# Patient Record
Sex: Female | Born: 1990 | Race: Black or African American | Hispanic: No | Marital: Single | Smoking: Never smoker
Health system: Southern US, Community
[De-identification: ages and names within clinical notes are randomized; demographics above are authoritative.]

## PROBLEM LIST (undated history)

## (undated) DIAGNOSIS — Z309 Encounter for contraceptive management, unspecified: Secondary | ICD-10-CM

## (undated) DIAGNOSIS — Z973 Presence of spectacles and contact lenses: Secondary | ICD-10-CM

## (undated) HISTORY — DX: Presence of spectacles and contact lenses: Z97.3

## (undated) HISTORY — DX: Encounter for contraceptive management, unspecified: Z30.9

---

## 2011-03-06 ENCOUNTER — Encounter (HOSPITAL_COMMUNITY): Payer: Self-pay | Admitting: Emergency Medicine

## 2011-03-06 ENCOUNTER — Emergency Department (HOSPITAL_COMMUNITY)
Admission: EM | Admit: 2011-03-06 | Discharge: 2011-03-07 | Disposition: A | Discharge status: Home / Self Care | Payer: Medicaid Other | Attending: Emergency Medicine | Admitting: Emergency Medicine

## 2011-03-06 DIAGNOSIS — Z79899 Other long term (current) drug therapy: Secondary | ICD-10-CM | POA: Insufficient documentation

## 2011-03-06 DIAGNOSIS — M545 Low back pain, unspecified: Secondary | ICD-10-CM | POA: Insufficient documentation

## 2011-03-06 DIAGNOSIS — R110 Nausea: Secondary | ICD-10-CM | POA: Insufficient documentation

## 2011-03-06 DIAGNOSIS — R109 Unspecified abdominal pain: Secondary | ICD-10-CM | POA: Insufficient documentation

## 2011-03-06 DIAGNOSIS — N390 Urinary tract infection, site not specified: Secondary | ICD-10-CM | POA: Insufficient documentation

## 2011-03-06 NOTE — Discharge Instructions (Signed)
Abdominal Pain Abdominal pain can be caused by many things. Your caregiver decides the seriousness of your pain by an examination and possibly blood tests and X-rays. Many cases can be observed and treated at home. Most abdominal pain is not caused by a disease and will probably improve without treatment. However, in many cases, more time must pass before a clear cause of the pain can be found. Before that point, it may not be known if you need more testing, or if hospitalization or surgery is needed. HOME CARE INSTRUCTIONS   Do not take laxatives unless directed by your caregiver.   Take pain medicine only as directed by your caregiver.   Only take over-the-counter or prescription medicines for pain, discomfort, or fever as directed by your caregiver.   Try a clear liquid diet (broth, tea, or water) for as long as directed by your caregiver. Slowly move to a bland diet as tolerated.  SEEK IMMEDIATE MEDICAL CARE IF:   The pain does not go away.   You have a fever.   You keep throwing up (vomiting).   The pain is felt only in portions of the abdomen. Pain in the right side could possibly be appendicitis. In an adult, pain in the left lower portion of the abdomen could be colitis or diverticulitis.   You pass bloody or black tarry stools.  MAKE SURE YOU:   Understand these instructions.   Will watch your condition.   Will get help right away if you are not doing well or get worse.  Document Released: 10/07/2004 Document Revised: 09/09/2010 Document Reviewed: 08/16/2007 Exit[employer] Patient Information 2012 [employer]  RESOURCE GUIDE  Dental Problems  Patients with Medicaid: [facility]                     [facility] [address]                                           [address] Phone:  [phone]                                                   Phone:  [phone]  If unable to pay or uninsured, contact:  [facility] or [place] [facility] to become qualified for the adult dental clinic.  Chronic Pain Problems Contact [facility]  [phone] Patients need to be referred by their primary care doctor.  Insufficient Money for Medicine Contact [employer]:  call "211" or [facility] 271-5999.  No Primary Care Doctor Call [facility]  832-8000 Other agencies that provide inexpensive medical care    [facility]  [phone]    [facility]  [phone]    [facility]  [phone]    [facility]  [phone]    [facility]  [phone]    [facility]  [phone]  [facility]  [phone] [facility]  [phone] [facility]   800 853-5163 (emergency services 641-4993)  Abuse/Neglect [place] Child Abuse Hotline (336) 641-3795 [place] [facility] Abuse Hotline 800-378-5315 (After Hours)  Emergency Shelter [facility] (336) 271-5985  Maternity Homes   [facility] (336) 275-9566 [facility] (704) 372-4663  MRSA Hotline #:   832-7006    [place] [facility]  [facility] [place]  [employer]                           [facility] [address] [place]                     [address]         [address]  [place]                                               [place]                              [place] Phone:  [phone]                                  Phone:  [phone]                   Phone:  [phone]  [facility] Phone:  [phone]  [place] Child Abuse Hotline (336) 342-1394 (336) 342-3537 (After Hours)  

## 2011-03-06 NOTE — ED Notes (Signed)
Pt states that she has had left sided abdominal pain for 4-5 days.  Denies vomiting/diarrhea.  Has been nauseous.  States pain 8/10.  Pt is laughing, joking and talking on the phone.

## 2011-03-06 NOTE — ED Provider Notes (Signed)
History     CSN: [id number]  Arrival date & time 03/06/11  2057   First MD Initiated Contact with Patient 03/06/11 2303      Chief Complaint  Patient presents with  . Abdominal Pain    left    (Consider location/radiation/quality/duration/timing/severity/associated sxs/prior treatment) HPI  C/o constant Lt abdominal pain x 4-5 days. Gradually worsening. +nausea, no vomiting. Denies back pain. Denies constipation/diarrhea. Denies hematuria/dysuria/freq/urgency today but states recently more frequency. Denies fever/chills. Worse with lying on left side or bending over twd left side. No alleviating factors. Has not taken anything for pain pta.  No sick contacts. Denies vaginal discharge/itching. No h/o STI. She is not complaining of abdominal pain at this time. Tolerating PO without difficulty.  No h/o abdominal surgeries  ED Notes, ED Provider Notes from 03/06/11 0000 to 03/06/11 21:15:10       Joellyn Haff, RN 03/06/2011 21:11      Pt states that she has had left sided abdominal pain for 4-5 days. Denies vomiting/diarrhea. Has been nauseous. States pain 8/10. Pt is laughing, joking and talking on the phone.    History reviewed. No pertinent past medical history.  History reviewed. No pertinent past surgical history.  History reviewed. No pertinent family history.  History  Substance Use Topics  . Smoking status: Never Smoker   . Smokeless tobacco: Not on file  . Alcohol Use: Yes    OB History    Grav Para Term Preterm Abortions TAB SAB Ect Mult Living                  Review of Systems  All other systems reviewed and are negative.  except as noted HPI   Allergies  Review of patient's allergies indicates no known allergies.  Home Medications   Current Outpatient Rx  Name Route Sig Dispense Refill  . CIPROFLOXACIN HCL 250 MG PO TABS Oral Take 1 tablet (250 mg total) by mouth every 12 (twelve) hours. 6 tablet 0    LMP 02/16/2011  Physical Exam    Nursing note and vitals reviewed. Constitutional: She is oriented to person, place, and time. She appears well-developed.  HENT:  Head: Atraumatic.  Mouth/Throat: Oropharynx is clear and moist.  Eyes: Conjunctivae and EOM are normal. Pupils are equal, round, and reactive to light.  Neck: Normal range of motion. Neck supple.  Cardiovascular: Normal rate, regular rhythm, normal heart sounds and intact distal pulses.   Pulmonary/Chest: Effort normal and breath sounds normal. No respiratory distress. She has no wheezes. She has no rales.  Abdominal: Soft. She exhibits no distension. There is no tenderness. There is no rebound and no guarding.       No CVAT  Genitourinary:       Bimanual exam without CMT  Musculoskeletal: Normal range of motion.       Min Lt lumbar paraspinal/lt flank ttp  Neurological: She is alert and oriented to person, place, and time.  Skin: Skin is warm and dry. No rash noted.  Psychiatric: She has a normal mood and affect.    ED Course  Procedures (including critical care time)  Labs Reviewed  URINALYSIS, ROUTINE W REFLEX MICROSCOPIC - Abnormal; Notable for the following:    APPearance CLOUDY (*)    Specific Gravity, Urine 1.031 (*)    Ketones, ur TRACE (*)    Leukocytes, UA MODERATE (*)    All other components within normal limits  URINE MICROSCOPIC-ADD ON - Abnormal; Notable for the following:  Squamous Epithelial / LPF MANY (*)    Bacteria, UA FEW (*)    Casts HYALINE CASTS (*)    All other components within normal limits  PREGNANCY, URINE  LAB REPORT - SCANNED   No results found.   1. UTI (lower urinary tract infection)       MDM  Patient presents with constant left upper quadrant, left lower quadrant abdominal pain for the past 4-5 days. She states she is having no pain at this time. No medications were taken pta or in the ED to relieve her pain. Her abdomen is completely nontender. She has mild Lt flank/paraspinal lumbar ttp. There is no  rebound, guarding or fullness. Her bimanual exam is unremarkable. She appears well. She is afebrile, her vitals are stable. She is well-hydrated. I do not feel that she needs labs or imaging of her abdomen at this time. She is comfortable with the plan. U/A and urine pregnancy ordered.  Urine sample contaminated but will treat given recent urinary frequency. F/U student health as needed. Precautions for return.      Forbes Cellar, MD 03/07/11 [phone]

## 2011-03-06 NOTE — ED Notes (Signed)
Lavender and Light green sent to lab.

## 2011-03-07 LAB — URINE MICROSCOPIC-ADD ON

## 2011-03-07 LAB — URINALYSIS, ROUTINE W REFLEX MICROSCOPIC
Bilirubin Urine: NEGATIVE
Nitrite: NEGATIVE
Specific Gravity, Urine: 1.031 — ABNORMAL HIGH (ref 1.005–1.030)
Urobilinogen, UA: 1 mg/dL (ref 0.0–1.0)
pH: 7 (ref 5.0–8.0)

## 2011-03-07 MED ORDER — CIPROFLOXACIN HCL 250 MG PO TABS: 250.0000 mg | ORAL_TABLET | Freq: Two times a day (BID) | ORAL | Status: AC

## 2012-05-19 ENCOUNTER — Emergency Department (HOSPITAL_COMMUNITY)
Admission: EM | Admit: 2012-05-19 | Discharge: 2012-05-19 | Disposition: A | Discharge status: Home / Self Care | Payer: BC Managed Care – PPO | Attending: Emergency Medicine | Admitting: Emergency Medicine

## 2012-05-19 ENCOUNTER — Encounter (HOSPITAL_COMMUNITY): Payer: Self-pay | Admitting: Emergency Medicine

## 2012-05-19 ENCOUNTER — Emergency Department (HOSPITAL_COMMUNITY): Payer: BC Managed Care – PPO

## 2012-05-19 DIAGNOSIS — S93601A Unspecified sprain of right foot, initial encounter: Secondary | ICD-10-CM

## 2012-05-19 DIAGNOSIS — X500XXA Overexertion from strenuous movement or load, initial encounter: Secondary | ICD-10-CM | POA: Insufficient documentation

## 2012-05-19 DIAGNOSIS — S93609A Unspecified sprain of unspecified foot, initial encounter: Secondary | ICD-10-CM | POA: Insufficient documentation

## 2012-05-19 DIAGNOSIS — Y9289 Other specified places as the place of occurrence of the external cause: Secondary | ICD-10-CM | POA: Insufficient documentation

## 2012-05-19 DIAGNOSIS — Y9301 Activity, walking, marching and hiking: Secondary | ICD-10-CM | POA: Insufficient documentation

## 2012-05-19 DIAGNOSIS — W010XXA Fall on same level from slipping, tripping and stumbling without subsequent striking against object, initial encounter: Secondary | ICD-10-CM | POA: Insufficient documentation

## 2012-05-19 MED ORDER — TRAMADOL HCL 50 MG PO TABS: 50.0000 mg | ORAL_TABLET | Freq: Four times a day (QID) | ORAL | Status: DC | PRN

## 2012-05-19 NOTE — ED Notes (Signed)
Pt verbalizes understanding 

## 2012-05-19 NOTE — ED Notes (Signed)
Pt c/o R foot pain after fall down steps @ 1400. Slight swelling noted.

## 2012-05-19 NOTE — ED Provider Notes (Signed)
History    This chart was scribed for Magnus Sinning, PA working with Gerhard Munch, MD by ED Scribe, Burman Nieves. This patient was seen in room WTR8/WTR8 and the patient's care was started at 11:04 PM.   CSN: [id number]  Arrival date & time 05/19/12  2243   First MD Initiated Contact with Patient 05/19/12 2304      Chief Complaint  Patient presents with  . Foot Injury    (Consider location/radiation/quality/duration/timing/severity/associated sxs/prior treatment) Patient is a 22 y.o. female presenting with foot injury. The history is provided by the patient. No language interpreter was used.  Foot Injury Location:  Foot Foot location:  R foot Pain details:    Timing:  Constant Worsened by:  Bearing weight and activity  HPI Comments: Braniyah Besse is a 22 y.o. female who presents to the Emergency Department complaining of moderate constant right foot pain due to falling earlier today at 4PM. Pt states she was walking down some steps when she tripped and fell injuring her right foot. She states after incident she went out to eat after her room mates graduation and noticed it started to swell and it became harder for her to ambulate. Currently pt ambulates with a right leg limp. She has not taken anything for pain prior to arrival.  Pt denies any LOC, headache, numbness, tingling, fever, chills,  nausea, vomiting,  weakness, and any other associated symptoms.   History reviewed. No pertinent past medical history.  History reviewed. No pertinent past surgical history.  No family history on file.  History  Substance Use Topics  . Smoking status: Never Smoker   . Smokeless tobacco: Not on file  . Alcohol Use: Yes     Comment: occ    OB History   Grav Para Term Preterm Abortions TAB SAB Ect Mult Living                  Review of Systems  Musculoskeletal: Positive for myalgias, joint swelling and arthralgias.  All other systems reviewed and are  negative.    Allergies  Review of patient's allergies indicates no known allergies.  Home Medications  No current outpatient prescriptions on file.  BP 135/61  Pulse 97  Temp(Src) 98.2 F (36.8 C) (Oral)  Resp 20  Wt 160 lb (72.576 kg)  SpO2 100%  LMP 05/15/2012  Physical Exam  Nursing note and vitals reviewed. Constitutional: She appears well-developed and well-nourished.  HENT:  Head: Normocephalic and atraumatic.  Mouth/Throat: Oropharynx is clear and moist.  Eyes: EOM are normal. Pupils are equal, round, and reactive to light.  Neck: Normal range of motion. Neck supple.  Cardiovascular: Normal rate, regular rhythm and normal heart sounds.   Pulses:      Dorsalis pedis pulses are 2+ on the right side.  Pulmonary/Chest: Effort normal and breath sounds normal. She has no wheezes.  Musculoskeletal: Normal range of motion. She exhibits tenderness.       Right ankle: She exhibits normal range of motion, no swelling, no ecchymosis and no deformity. No tenderness.  Swelling of the dorsal aspect of her right foot. Tenderness to palpation to the 4th and 5th metatarsal.  Neurological: She is alert.  Sensation intact in right foot  Skin: Skin is warm and dry.  Psychiatric: She has a normal mood and affect. Her behavior is normal.    ED Course  Procedures (including critical care time) DIAGNOSTIC STUDIES: Oxygen Saturation is 100% on room air, normal by my interpretation.  COORDINATION OF CARE: 11:23 PM Discussed ED treatment with pt and pt agrees. Advised to ice affected area for swelling and take prescription medication for pain.     Labs Reviewed - No data to display Dg Foot Complete Right  05/19/2012  *RADIOLOGY REPORT*  Clinical Data: Right foot pain and metatarsals, fell down stairs  RIGHT FOOT COMPLETE - 3+ VIEW  Comparison: None  Findings: Osseous mineralization normal. Joint spaces preserved. No acute fracture, dislocation or bone destruction.  IMPRESSION: No  acute osseous abnormalities.   Original Report Authenticated By: Ulyses Southward, M.D.      No diagnosis found.    MDM  Patient presenting with foot pain that occurred after twisting the foot earlier today.  Patient was able to ambulate, but had pain with ambulation.  Xray negative.  Patient neurovascularly intact.  Foot wrapped with ACE wrap and patient given crutches for comfort.    I personally performed the services described in this documentation, which was scribed in my presence. The recorded information has been reviewed and is accurate.   Pascal Lux [place], PA-C 05/20/12 2041

## 2012-05-20 NOTE — ED Provider Notes (Signed)
  Medical screening examination/treatment/procedure(s) were performed by non-physician practitioner and as supervising physician I was immediately available for consultation/collaboration.    Sayda Grable, MD 05/20/12 2339 

## 2013-06-06 ENCOUNTER — Ambulatory Visit (INDEPENDENT_AMBULATORY_CARE_PROVIDER_SITE_OTHER): Payer: BC Managed Care – PPO | Admitting: Physician Assistant

## 2013-06-06 VITALS — BP 110/64 | HR 84 | Temp 98.4°F | Resp 16 | Ht 63.75 in | Wt 172.0 lb

## 2013-06-06 DIAGNOSIS — Z0000 Encounter for general adult medical examination without abnormal findings: Secondary | ICD-10-CM

## 2013-06-06 DIAGNOSIS — Z111 Encounter for screening for respiratory tuberculosis: Secondary | ICD-10-CM

## 2013-06-06 NOTE — Patient Instructions (Signed)
Come back in 48-72 hours to have your TB test read (between 5-6pm Friday, or between 8am-5pm Saturday.)  Bring your paperwork with you to be completed.     Health Maintenance, Female A healthy lifestyle and preventative care can promote health and wellness.  Maintain regular health, dental, and eye exams.  Eat a healthy diet. Foods like vegetables, fruits, whole grains, low-fat dairy products, and lean protein foods contain the nutrients you need without too many calories. Decrease your intake of foods high in solid fats, added sugars, and salt. Get information about a proper diet from your caregiver, if necessary.  Regular physical exercise is one of the most important things you can do for your health. Most adults should get at least 150 minutes of moderate-intensity exercise (any activity that increases your heart rate and causes you to sweat) each week. In addition, most adults need muscle-strengthening exercises on 2 or more days a week.   Maintain a healthy weight. The body mass index (BMI) is a screening tool to identify possible weight problems. It provides an estimate of body fat based on height and weight. Your caregiver can help determine your BMI, and can help you achieve or maintain a healthy weight. For adults 20 years and older:  A BMI below 18.5 is considered underweight.  A BMI of 18.5 to 24.9 is normal.  A BMI of 25 to 29.9 is considered overweight.  A BMI of 30 and above is considered obese.  Maintain normal blood lipids and cholesterol by exercising and minimizing your intake of saturated fat. Eat a balanced diet with plenty of fruits and vegetables. Blood tests for lipids and cholesterol should begin at age 66 and be repeated every 5 years. If your lipid or cholesterol levels are high, you are over 50, or you are a high risk for heart disease, you may need your cholesterol levels checked more frequently.Ongoing high lipid and cholesterol levels should be treated with  medicines if diet and exercise are not effective.  If you smoke, find out from your caregiver how to quit. If you do not use tobacco, do not start.  Lung cancer screening is recommended for adults aged 56 80 years who are at high risk for developing lung cancer because of a history of smoking. Yearly low-dose computed tomography (CT) is recommended for people who have at least a 30-pack-year history of smoking and are a current smoker or have quit within the past 15 years. A pack year of smoking is smoking an average of 1 pack of cigarettes a day for 1 year (for example: 1 pack a day for 30 years or 2 packs a day for 15 years). Yearly screening should continue until the smoker has stopped smoking for at least 15 years. Yearly screening should also be stopped for people who develop a health problem that would prevent them from having lung cancer treatment.  If you are pregnant, do not drink alcohol. If you are breastfeeding, be very cautious about drinking alcohol. If you are not pregnant and choose to drink alcohol, do not exceed 1 drink per day. One drink is considered to be 12 ounces (355 mL) of beer, 5 ounces (148 mL) of wine, or 1.5 ounces (44 mL) of liquor.  Avoid use of street drugs. Do not share needles with anyone. Ask for help if you need support or instructions about stopping the use of drugs.  High blood pressure causes heart disease and increases the risk of stroke. Blood pressure should be  checked at least every 1 to 2 years. Ongoing high blood pressure should be treated with medicines, if weight loss and exercise are not effective.  If you are 53 to 22 years old, ask your caregiver if you should take aspirin to prevent strokes.  Diabetes screening involves taking a blood sample to check your fasting blood sugar level. This should be done once every 3 years, after age 96, if you are within normal weight and without risk factors for diabetes. Testing should be considered at a younger age or  be carried out more frequently if you are overweight and have at least 1 risk factor for diabetes.  Breast cancer screening is essential preventative care for women. You should practice "breast self-awareness." This means understanding the normal appearance and feel of your breasts and may include breast self-examination. Any changes detected, no matter how small, should be reported to a caregiver. Women in their 90s and 30s should have a clinical breast exam (CBE) by a caregiver as part of a regular health exam every 1 to 3 years. After age 80, women should have a CBE every year. Starting at age 36, women should consider having a mammogram (breast X-ray) every year. Women who have a family history of breast cancer should talk to their caregiver about genetic screening. Women at a high risk of breast cancer should talk to their caregiver about having an MRI and a mammogram every year.  Breast cancer gene (BRCA)-related cancer risk assessment is recommended for women who have family members with BRCA-related cancers. BRCA-related cancers include breast, ovarian, tubal, and peritoneal cancers. Having family members with these cancers may be associated with an increased risk for harmful changes (mutations) in the breast cancer genes BRCA1 and BRCA2. Results of the assessment will determine the need for genetic counseling and BRCA1 and BRCA2 testing.  The Pap test is a screening test for cervical cancer. Women should have a Pap test starting at age 72. Between ages 66 and 31, Pap tests should be repeated every 2 years. Beginning at age 30, you should have a Pap test every 3 years as long as the past 3 Pap tests have been normal. If you had a hysterectomy for a problem that was not cancer or a condition that could lead to cancer, then you no longer need Pap tests. If you are between ages 9 and 49, and you have had normal Pap tests going back 10 years, you no longer need Pap tests. If you have had past treatment  for cervical cancer or a condition that could lead to cancer, you need Pap tests and screening for cancer for at least 20 years after your treatment. If Pap tests have been discontinued, risk factors (such as a new sexual partner) need to be reassessed to determine if screening should be resumed. Some women have medical problems that increase the chance of getting cervical cancer. In these cases, your caregiver may recommend more frequent screening and Pap tests.  The human papillomavirus (HPV) test is an additional test that may be used for cervical cancer screening. The HPV test looks for the virus that can cause the cell changes on the cervix. The cells collected during the Pap test can be tested for HPV. The HPV test could be used to screen women aged 47 years and older, and should be used in women of any age who have unclear Pap test results. After the age of 70, women should have HPV testing at the same frequency as  a Pap test.  Colorectal cancer can be detected and often prevented. Most routine colorectal cancer screening begins at the age of 32 and continues through age 7. However, your caregiver may recommend screening at an earlier age if you have risk factors for colon cancer. On a yearly basis, your caregiver may provide home test kits to check for hidden blood in the stool. Use of a small camera at the end of a tube, to directly examine the colon (sigmoidoscopy or colonoscopy), can detect the earliest forms of colorectal cancer. Talk to your caregiver about this at age 20, when routine screening begins. Direct examination of the colon should be repeated every 5 to 10 years through age 45, unless early forms of pre-cancerous polyps or small growths are found.  Hepatitis C blood testing is recommended for all people born from [birth year] through 1964 and any individual with known risks for hepatitis C.  Practice safe sex. Use condoms and avoid high-risk sexual practices to reduce the spread of sexually  transmitted infections (STIs). Sexually active women aged 37 and younger should be checked for Chlamydia, which is a common sexually transmitted infection. Older women with new or multiple partners should also be tested for Chlamydia. Testing for other STIs is recommended if you are sexually active and at increased risk.  Osteoporosis is a disease in which the bones lose minerals and strength with aging. This can result in serious bone fractures. The risk of osteoporosis can be identified using a bone density scan. Women ages 76 and over and women at risk for fractures or osteoporosis should discuss screening with their caregivers. Ask your caregiver whether you should be taking a calcium supplement or vitamin D to reduce the rate of osteoporosis.  Menopause can be associated with physical symptoms and risks. Hormone replacement therapy is available to decrease symptoms and risks. You should talk to your caregiver about whether hormone replacement therapy is right for you.  Use sunscreen. Apply sunscreen liberally and repeatedly throughout the day. You should seek shade when your shadow is shorter than you. Protect yourself by wearing long sleeves, pants, a wide-brimmed hat, and sunglasses year round, whenever you are outdoors.  Notify your caregiver of new moles or changes in moles, especially if there is a change in shape or color. Also notify your caregiver if a mole is larger than the size of a pencil eraser.  Stay current with your immunizations. Document Released: 07/13/2010 Document Revised: 04/24/2012 Document Reviewed: 07/13/2010 [facility] Patient Information 2014 [employer].

## 2013-06-06 NOTE — Progress Notes (Signed)
   Subjective:    Patient ID: Sandra Howard, female    DOB: [birth year], 23 y.o.   MRN: [id number]  HPI    Sandra Howard is a very pleasant 23 yr old female here for CPE.  Last CPE in high school.  Currently no PCP as former PCP stopped practicing.  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  Complaints:  none LMP:  End of April 2015; due for next period tomorrow; regular every month Not currently sexually active - 3 lifetime partners; condoms 100% of the time Contraception:  OCPs GYN:  Had pap last year - normal; no concern for STI, declines testing today; last testing sometime last year - no new partners since Dentist:  Not regularly - afraid of dentist Eye doctor:  Glasses and contacts; last in October, goes annually Imm:  Thinks utd including gardasil Diet:   Could be better, picky eater; occ soda, mostly water Exercise:  occ but hasn't lately; 30 minutes cardio and exercise routine; 3x/wk when she was exercising Meds:  OCP. 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   HENT: Negative.   Respiratory: Negative.   Cardiovascular: Negative.   Gastrointestinal: Negative.   Musculoskeletal: Negative.   Skin: Negative.        Objective:   Physical Exam  Vitals reviewed. Constitutional: She is oriented to person, place, and time. She appears well-developed and well-nourished. No distress.  HENT:  Head: Normocephalic and atraumatic.  Right Ear: Tympanic membrane and ear canal normal.  Left Ear: Tympanic membrane and ear canal normal.  Mouth/Throat: Uvula is midline, oropharynx is clear and moist and mucous membranes are normal.  Eyes:  Conjunctivae and EOM are normal. Pupils are equal, round, and reactive to light. No scleral icterus.  Neck: Normal range of motion. Neck supple.  Cardiovascular: Normal rate, regular rhythm and normal heart sounds.   Pulmonary/Chest: Effort normal and breath sounds normal. She has no wheezes. She has no rales.  Abdominal: Soft. Bowel sounds are normal. There is no tenderness.  Musculoskeletal: She exhibits no edema.  Lymphadenopathy:    She has no cervical adenopathy.  Neurological: She is alert and oriented to person, place, and time. She has normal reflexes.  Skin: Skin is warm and dry.  Psychiatric: She has a normal mood and affect. Her behavior is normal.       Assessment & Plan:  Routine general medical examination at a health care facility  Screening for tuberculosis - Plan: TB Skin Test   Sandra Howard is a very pleasant 23 yr old female here for CPE.  She appears to be in good health.  Exam is normal.  UTD on imm.  Normal pap last year (due in 2017.)  Pt declines lab work today.  PPD placed - RTC 48-72 hours for read.  PPW completed.     Loleta Dicker MHS, PA-C [facility] 5/28/20157:25 PM

## 2013-06-06 NOTE — Progress Notes (Signed)

## 2013-06-08 ENCOUNTER — Ambulatory Visit (INDEPENDENT_AMBULATORY_CARE_PROVIDER_SITE_OTHER): Payer: BC Managed Care – PPO | Admitting: Physician Assistant

## 2013-06-08 DIAGNOSIS — Z09 Encounter for follow-up examination after completed treatment for conditions other than malignant neoplasm: Secondary | ICD-10-CM

## 2013-06-08 DIAGNOSIS — Z111 Encounter for screening for respiratory tuberculosis: Secondary | ICD-10-CM

## 2013-06-08 LAB — TB SKIN TEST
Induration: 0 mm
TB SKIN TEST: NEGATIVE

## 2013-06-08 NOTE — Progress Notes (Signed)
Patient here for nursing visit only.  The patient was not seen by a provider.  Here for TB read only.  

## 2013-07-04 ENCOUNTER — Ambulatory Visit (INDEPENDENT_AMBULATORY_CARE_PROVIDER_SITE_OTHER): Payer: BC Managed Care – PPO | Admitting: Medical

## 2013-07-04 ENCOUNTER — Encounter: Payer: Self-pay | Admitting: Medical

## 2013-07-04 VITALS — BP 100/68 | HR 82 | Temp 98.2°F | Resp 16 | Ht 64.0 in | Wt 174.0 lb

## 2013-07-04 DIAGNOSIS — Z0000 Encounter for general adult medical examination without abnormal findings: Secondary | ICD-10-CM

## 2013-07-04 DIAGNOSIS — Z113 Encounter for screening for infections with a predominantly sexual mode of transmission: Secondary | ICD-10-CM

## 2013-07-04 DIAGNOSIS — K5900 Constipation, unspecified: Secondary | ICD-10-CM

## 2013-07-04 DIAGNOSIS — Z3002 Counseling and instruction in natural family planning to avoid pregnancy: Secondary | ICD-10-CM

## 2013-07-04 LAB — POCT URINALYSIS DIPSTICK
BILIRUBIN UA: NEGATIVE
GLUCOSE UA: NEGATIVE
Ketones, UA: NEGATIVE
Nitrite, UA: NEGATIVE
RBC UA: NEGATIVE
Urobilinogen, UA: NEGATIVE
pH, UA: 7

## 2013-07-04 LAB — POCT WET PREP (WET MOUNT)
Clue Cells Wet Prep Whiff POC: NEGATIVE
WBC, Wet Prep HPF POC: NEGATIVE

## 2013-07-04 LAB — POCT URINE PREGNANCY: PREG TEST UR: NEGATIVE

## 2013-07-04 MED ORDER — LEVONORGEST-ETH ESTRAD 91-DAY 0.15-0.03 MG PO TABS: 1.0000 | ORAL_TABLET | Freq: Every day | ORAL | Status: AC

## 2013-07-04 NOTE — Patient Instructions (Signed)
Thank you for giving me the opportunity to serve you today.    Your diagnosis today includes: Encounter Diagnoses  Name Primary?  . Counseling and instruction in natural family planning to avoid pregnancy Yes  . Unspecified constipation   . Routine general medical examination at a health care facility   . Screen for STD (sexually transmitted disease)      Specific recommendations today include:  Begin Seasonale birth control the Sunday after your period starts.  Continue eating healthy, exercising regularly  Check insurance about your copay/cost for routine screening labs for anemia, cholesterol, blood sugar and thyroid.  Return for physical in 05/2014, sooner if needed.    I have included other useful information below for your review.  Ethinyl Estradiol; Levonorgestrel tablets What is this medicine? ETHINYL ESTRADIOL; LEVONORGESTREL (ETH in il es tra DYE ole; LEE voh nor jes trel) is an oral contraceptive. It combines two types of female hormones, an estrogen and a progestin. They are used to prevent ovulation and pregnancy. This medicine may be used for other purposes; ask your health care Yates Weisgerber or pharmacist if you have questions. COMMON BRAND NAME(S): Alesse, Altavera, Amethia, Amethia Lo, Amethyst, Aubra-28, Aviane, Camrese, Camrese Lo, Columbiahateal, [place], [address],Krise 3Delyla, KeotaEnpresse, [place] CityFALMINA, Bloom[place]ntrovale, HuntJolessa, BlodgettKurvelo, [place] Valley[place]ina, GarrettLevlen, HonorLevlite, PontiacLEVONEST, CrowleyLevora, OpelousasLoSeasonique, Bakers[place]Lutera, EvanstonLybrel, FeltMARLISSA, DanteMyzilra, CayeyNordette, Port GibsonOrsythia, ManchesterPortia, ClintonQuartette, ManitouQuasense, LindenSeasonale, Port Tobacco VillageSeasonique, Spring MillsSronyx, Miller Cityri-Levlen, Triphasil, Lennie Hummerrivora What should I tell my health care Ardath Lepak before I take this medicine? They need to know if you have or ever had any of these conditions: -abnormal vaginal bleeding -blood vessel disease or blood clots -breast, cervical, endometrial, ovarian, liver, or uterine cancer -diabetes -gallbladder disease -heart disease or recent heart attack -high blood pressure -high  cholesterol -kidney disease -liver disease -migraine headaches -stroke -systemic lupus erythematosus (SLE) -tobacco smoker -an unusual or allergic reaction to estrogens, progestins, other medicines, foods, dyes, or preservatives -pregnant or trying to get pregnant -breast-feeding How should I use this medicine? Take this medicine by mouth. To reduce nausea, this medicine may be taken with food. Follow the directions on the prescription label. Take this medicine at the same time each day and in the order directed on the package. Do not take your medicine more often than directed. Contact your pediatrician regarding the use of this medicine in children. Special care may be needed. This medicine has been used in female children who have started having menstrual periods. A patient package insert for the product will be given with each prescription and refill. Read this sheet carefully each time. The sheet may change frequently. Overdosage: If you think you have taken too much of this medicine contact a poison control center or emergency room at once. NOTE: This medicine is only for you. Do not share this medicine with others. What if I miss a dose? If you miss a dose, refer to the patient information sheet you received with your medicine for direction. If you miss more than one pill, this medicine may not be as effective and you may need to use another form of birth control. What may interact with this medicine? -acetaminophen -antibiotics or medicines for infections, especially rifampin, rifabutin, rifapentine, and griseofulvin, and possibly penicillins or tetracyclines -aprepitant -ascorbic acid (vitamin C) -atorvastatin -barbiturate medicines, such as phenobarbital -bosentan -carbamazepine -caffeine -clofibrate -cyclosporine -dantrolene -doxercalciferol -felbamate -grapefruit juice -hydrocortisone -medicines for anxiety or sleeping problems, such as diazepam or temazepam -medicines  for diabetes, including pioglitazone -mineral oil -modafinil -mycophenolate -nefazodone -oxcarbazepine -phenytoin -prednisolone -ritonavir or other medicines for HIV  infection or AIDS -rosuvastatin -selegiline -soy isoflavones supplements -St. John's wort -tamoxifen or raloxifene -theophylline -thyroid hormones -topiramate -warfarin This list may not describe all possible interactions. Give your health care Brittanny Levenhagen a list of all the medicines, herbs, non-prescription drugs, or dietary supplements you use. Also tell them if you smoke, drink alcohol, or use illegal drugs. Some items may interact with your medicine. What should I watch for while using this medicine? Visit your doctor or health care professional for regular checks on your progress. You will need a regular breast and pelvic exam and Pap smear while on this medicine. Use an additional method of contraception during the first cycle that you take these tablets. If you have any reason to think you are pregnant, stop taking this medicine right away and contact your doctor or health care professional. If you are taking this medicine for hormone related problems, it may take several cycles of use to see improvement in your condition. Smoking increases the risk of getting a blood clot or having a stroke while you are taking birth control pills, especially if you are more than 22 years old. You are strongly advised not to smoke. This medicine can make your body retain fluid, making your fingers, hands, or ankles swell. Your blood pressure can go up. Contact your doctor or health care professional if you feel you are retaining fluid. This medicine can make you more sensitive to the sun. Keep out of the sun. If you cannot avoid being in the sun, wear protective clothing and use sunscreen. Do not use sun lamps or tanning beds/booths. If you wear contact lenses and notice visual changes, or if the lenses begin to feel uncomfortable,  consult your eye care specialist. In some women, tenderness, swelling, or minor bleeding of the gums may occur. Notify your dentist if this happens. Brushing and flossing your teeth regularly may help limit this. See your dentist regularly and inform your dentist of the medicines you are taking. If you are going to have elective surgery, you may need to stop taking this medicine before the surgery. Consult your health care professional for advice. This medicine does not protect you against HIV infection (AIDS) or any other sexually transmitted diseases. What side effects may I notice from receiving this medicine? Side effects that you should report to your doctor or health care professional as soon as possible: -breast tissue changes or discharge -changes in vaginal bleeding during your period or between your periods -chest pain -coughing up blood -dizziness or fainting spells -headaches or migraines -leg, arm or groin pain -severe or sudden headaches -stomach pain (severe) -sudden shortness of breath -sudden loss of coordination, especially on one side of the body -speech problems -symptoms of vaginal infection like itching, irritation or unusual discharge -tenderness in the upper abdomen -vomiting -weakness or numbness in the arms or legs, especially on one side of the body -yellowing of the eyes or skin Side effects that usually do not require medical attention (report to your doctor or health care professional if they continue or are bothersome): -breakthrough bleeding and spotting that continues beyond the 3 initial cycles of pills -breast tenderness -mood changes, anxiety, depression, frustration, anger, or emotional outbursts -increased sensitivity to sun or ultraviolet light -nausea -skin rash, acne, or brown spots on the skin -weight gain (slight) This list may not describe all possible side effects. Call your doctor for medical advice about side effects. You may report side  effects to FDA at 1-800-FDA-1088. Where should I  keep my medicine? Keep out of the reach of children. Store at room temperature between 15 and 30 degrees C (59 and 86 degrees F). Throw away any unused medicine after the expiration date. NOTE: This sheet is a summary. It may not cover all possible information. If you have questions about this medicine, talk to your doctor, pharmacist, or health care Trea Carnegie.  2015, Elsevier/Gold Standard. (2007-12-14 13:24:25)

## 2013-07-04 NOTE — Progress Notes (Signed)
Subjective:   HPI  Sandra Howard is a 23 y.o. female who presents for a complete physical/establish visit.  New patient today.   Preventative care: Last ophthalmology visit:yes [facility] Last dental visit:n/a Last colonoscopy:n/a Last mammogram:n/a Last gynecological exam:07/04/13 Last EKG:n/a Last labs:new  Prior vaccinations: TD or Tdap:up to date Influenza:n/a Pneumococcal:n/a Shingles/Zostavax:n/a HPV vaccine completed  Concerns: Wants to change OCPs.  Has been on Reclipsen OCP for 2 years, tolerated this well, but doesn't want a period at all.  No prior pregnancy.   No recent sexual activity, but has had 1 new partner in last 2 years since last STD screen.  No current symptoms.  Last pap 2014 normal per patient.     Otherwise in normal state of health without c/o.   Reviewed their medical, surgical, family, social, medication, and allergy history and updated chart as appropriate.  Past Medical History  Diagnosis Date  . Wears glasses   . Contraception management     History reviewed. No pertinent past surgical history.  History   Social History  . Marital Status: Single    Spouse Name: N/A    Number of Children: N/A  . Years of Education: N/A   Occupational History  . Not on file.   Social History Main Topics  . Smoking status: Never Smoker   . Smokeless tobacco: Not on file  . Alcohol Use: Yes     Comment: occ  . Drug Use: No  . Sexual Activity: Not Currently    Birth Control/ Protection: Condom, Pill   Other Topics Concern  . 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   Working at [employer], [employer] full time, exercise moderately, walking    Family History  Problem Relation Age of Onset  . Arthritis Maternal Grandmother   . Cancer Maternal Grandmother     colon cancer  . Arthritis Maternal Grandfather   . Hypertension Paternal Grandfather   . Diabetes  Paternal Grandfather   . Cancer Maternal Aunt     breast  . Diabetes Maternal Uncle   . Other Father     unknown    Current outpatient prescriptions:desogestrel-ethinyl estradiol (APRI,EMOQUETTE,SOLIA) 0.15-30 MG-MCG tablet, Take 1 tablet by mouth daily., Disp: , Rfl: ;  levonorgestrel-ethinyl estradiol (SEASONALE) 0.15-0.03 MG tablet, Take 1 tablet by mouth daily., Disp: 1 Package, Rfl: 4  No Known Allergies   Review of Systems Constitutional: -fever, -chills, -sweats, -unexpected weight change, -decreased appetite, -fatigue Allergy: -sneezing, -itching, -congestion Dermatology: -changing moles, --rash, -lumps ENT: -runny nose, -ear pain, -sore throat, -hoarseness, -sinus pain, -teeth pain, - ringing in ears, -hearing loss, -nosebleeds Cardiology: -chest pain, -palpitations, -swelling, -difficulty breathing when lying flat, -waking up short of breath Respiratory: -cough, -shortness of breath, -difficulty breathing with exercise or exertion, -wheezing, -coughing up blood Gastroenterology: -abdominal pain, -nausea, -vomiting, -diarrhea, -constipation, -blood in stool, -changes in bowel movement, -difficulty swallowing or eating Hematology: -bleeding, -bruising  Musculoskeletal: -joint aches, -muscle aches, -joint swelling, -back pain, -neck pain, -cramping, -changes in gait Ophthalmology: denies vision changes, eye redness, itching, discharge Urology: -burning with urination, -difficulty urinating, -blood in urine, -urinary frequency, -urgency, -incontinence Neurology: -headache, -weakness, -tingling, -numbness, -memory loss, -falls, -dizziness Psychology: -depressed mood, -agitation, -sleep problems     Objective:   Physical Exam  BP 100/68  Pulse 82  Temp(Src) 98.2 F (36.8 C) (Oral)  Resp 16  Ht 5\' 4"  (1.626 m)  Wt 174 lb (78.926 kg)  BMI 29.85 kg/m2  LMP 05/08/2013  General appearance: alert, no distress, WD/WN, AA female Skin: unremarkable HEENT: normocephalic,  conjunctiva/corneas normal, sclerae anicteric, PERRLA, EOMi, nares patent, no discharge or erythema, pharynx normal Oral cavity: MMM, tongue normal, teeth in good repair Neck: supple, no lymphadenopathy, no thyromegaly, no masses, normal ROM Heart: RRR, normal S1, S2, no murmurs Lungs: CTA bilaterally, no wheezes, rhonchi, or rales Abdomen: +bs, soft, non tender, non distended, no masses, no hepatomegaly, no splenomegaly, no bruits Extremities: no edema, no cyanosis, no clubbing Pulses: 2+ symmetric, upper and lower extremities, normal cap refill Neurological: alert, oriented x 3, CN2-12 intact, gait nornmal Psychiatric: normal affect, behavior normal, pleasant  Breast: not examined Gyn: Normal external genitalia without lesions, vagina with normal mucosa, clitoral piercing, cervix without lesions, no cervical motion tenderness, slight white abnormal vaginal discharge.  Uterus and adnexa not enlarged, nontender, no masses.  Exam chaperoned by nurse. Rectal: deferred   Assessment and Plan :   She actually was seen at [facility] 05/2013 and technically had a physical.  Thus, we are seeing her for establish care visit, contraception which is her main concern, and medication changes  Counseling on family planning, contraception management  - discussed options for contraception, risks/benefits of contraception/hormonal OCPs, other preventative measures.  She would like extended cycle medication, thus, begin Seasonale.   Constipation - discussed water and fiber intake.  Discussed other possible treatment, but she declines at this time  STD screening today, discussed safe sex.  Wet prep here normal today

## 2013-07-05 LAB — RPR

## 2013-07-05 LAB — GC/CHLAMYDIA PROBE AMP
CT PROBE, AMP APTIMA: NEGATIVE
GC PROBE AMP APTIMA: NEGATIVE

## 2013-07-05 LAB — HIV ANTIBODY (ROUTINE TESTING W REFLEX): HIV 1&2 Ab, 4th Generation: NONREACTIVE

## 2013-07-19 ENCOUNTER — Encounter (HOSPITAL_COMMUNITY): Payer: Self-pay | Admitting: Emergency Medicine

## 2013-07-19 ENCOUNTER — Emergency Department (HOSPITAL_COMMUNITY)
Admission: EM | Admit: 2013-07-19 | Discharge: 2013-07-20 | Disposition: A | Discharge status: Home / Self Care | Payer: BC Managed Care – PPO | Attending: Emergency Medicine | Admitting: Emergency Medicine

## 2013-07-19 DIAGNOSIS — N390 Urinary tract infection, site not specified: Secondary | ICD-10-CM

## 2013-07-19 DIAGNOSIS — IMO0001 Reserved for inherently not codable concepts without codable children: Secondary | ICD-10-CM | POA: Insufficient documentation

## 2013-07-19 DIAGNOSIS — M7918 Myalgia, other site: Secondary | ICD-10-CM

## 2013-07-19 DIAGNOSIS — Z79899 Other long term (current) drug therapy: Secondary | ICD-10-CM | POA: Insufficient documentation

## 2013-07-19 DIAGNOSIS — R110 Nausea: Secondary | ICD-10-CM | POA: Insufficient documentation

## 2013-07-19 DIAGNOSIS — R109 Unspecified abdominal pain: Secondary | ICD-10-CM

## 2013-07-19 DIAGNOSIS — Z3202 Encounter for pregnancy test, result negative: Secondary | ICD-10-CM | POA: Insufficient documentation

## 2013-07-19 LAB — CBC WITH DIFFERENTIAL/PLATELET
BASOS PCT: 0 % (ref 0–1)
Basophils Absolute: 0 10*3/uL (ref 0.0–0.1)
Eosinophils Absolute: 0.1 10*3/uL (ref 0.0–0.7)
Eosinophils Relative: 2 % (ref 0–5)
HCT: 35 % — ABNORMAL LOW (ref 36.0–46.0)
HEMOGLOBIN: 11.6 g/dL — AB (ref 12.0–15.0)
LYMPHS ABS: 2.3 10*3/uL (ref 0.7–4.0)
Lymphocytes Relative: 46 % (ref 12–46)
MCH: 31.4 pg (ref 26.0–34.0)
MCHC: 33.1 g/dL (ref 30.0–36.0)
MCV: 94.6 fL (ref 78.0–100.0)
MONOS PCT: 9 % (ref 3–12)
Monocytes Absolute: 0.5 10*3/uL (ref 0.1–1.0)
NEUTROS ABS: 2.2 10*3/uL (ref 1.7–7.7)
NEUTROS PCT: 43 % (ref 43–77)
Platelets: 271 10*3/uL (ref 150–400)
RBC: 3.7 MIL/uL — AB (ref 3.87–5.11)
RDW: 11.9 % (ref 11.5–15.5)
WBC: 5.1 10*3/uL (ref 4.0–10.5)

## 2013-07-19 LAB — URINALYSIS, ROUTINE W REFLEX MICROSCOPIC
Bilirubin Urine: NEGATIVE
GLUCOSE, UA: NEGATIVE mg/dL
HGB URINE DIPSTICK: NEGATIVE
Ketones, ur: NEGATIVE mg/dL
Nitrite: NEGATIVE
PH: 6 (ref 5.0–8.0)
PROTEIN: NEGATIVE mg/dL
SPECIFIC GRAVITY, URINE: 1.027 (ref 1.005–1.030)
Urobilinogen, UA: 1 mg/dL (ref 0.0–1.0)

## 2013-07-19 LAB — COMPREHENSIVE METABOLIC PANEL
ALBUMIN: 3.1 g/dL — AB (ref 3.5–5.2)
ALK PHOS: 67 U/L (ref 39–117)
ALT: 10 U/L (ref 0–35)
ANION GAP: 12 (ref 5–15)
AST: 18 U/L (ref 0–37)
BUN: 6 mg/dL (ref 6–23)
CHLORIDE: 102 meq/L (ref 96–112)
CO2: 25 mEq/L (ref 19–32)
Calcium: 8.9 mg/dL (ref 8.4–10.5)
Creatinine, Ser: 0.75 mg/dL (ref 0.50–1.10)
GFR calc Af Amer: >90 mL/min (ref >90)
GFR calc non Af Amer: >90 mL/min (ref >90)
Glucose, Bld: 102 mg/dL — ABNORMAL HIGH (ref 70–99)
POTASSIUM: 3.2 meq/L — AB (ref 3.7–5.3)
SODIUM: 139 meq/L (ref 137–147)
Total Protein: 7.1 g/dL (ref 6.0–8.3)

## 2013-07-19 LAB — POC URINE PREG, ED: Preg Test, Ur: NEGATIVE

## 2013-07-19 LAB — LIPASE, BLOOD: Lipase: 20 U/L (ref 11–59)

## 2013-07-19 MED ORDER — ONDANSETRON 8 MG PO TBDP
8.0000 mg | ORAL_TABLET | Freq: Once | ORAL | Status: AC
  Administered 2013-07-20: 8 mg via ORAL
  Filled 2013-07-19: qty 1

## 2013-07-19 MED ORDER — NAPROXEN 500 MG PO TABS
500.0000 mg | ORAL_TABLET | Freq: Once | ORAL | Status: AC
Start: 2013-07-20 — End: 2013-07-20
  Administered 2013-07-20: 500 mg via ORAL
  Filled 2013-07-19: qty 1

## 2013-07-19 NOTE — ED Notes (Signed)
Pt c/o left sided abd / rib pain that began last weekend; pt states that she is alsop experiencing nausea; denies vomiting; states is having urinary frequency; denies urgency or burning; pt states that she has had bowel movements that were normal for the last 2 days.

## 2013-07-20 LAB — URINE MICROSCOPIC-ADD ON

## 2013-07-20 MED ORDER — NAPROXEN 500 MG PO TABS: 500.0000 mg | ORAL_TABLET | Freq: Two times a day (BID) | ORAL | Status: AC

## 2013-07-20 MED ORDER — ONDANSETRON 8 MG PO TBDP: 8.0000 mg | ORAL_TABLET | Freq: Three times a day (TID) | ORAL | Status: AC | PRN

## 2013-07-20 MED ORDER — SULFAMETHOXAZOLE-TRIMETHOPRIM 800-160 MG PO TABS: 1.0000 | ORAL_TABLET | Freq: Two times a day (BID) | ORAL | Status: AC

## 2013-07-20 MED ORDER — SULFAMETHOXAZOLE-TMP DS 800-160 MG PO TABS
1.0000 | ORAL_TABLET | Freq: Once | ORAL | Status: AC
  Administered 2013-07-20: 1 via ORAL
  Filled 2013-07-20: qty 1

## 2013-07-20 NOTE — ED Provider Notes (Signed)
CSN: [id number]     Arrival date & time 07/19/13  2111 History   First MD Initiated Contact with Patient 07/19/13 2325     Chief Complaint  Patient presents with  . Abdominal Pain     (Consider location/radiation/quality/duration/timing/severity/associated sxs/prior Treatment) HPI 23 year old female presents to department with complaint of left flank pain.  She reports symptoms started about 5 days ago.  Pain is described as a irritation and aggravation.  She denies the pain is sharp or crampy in nature.  She reports that she has difficulty describing the pain.  It is worse with movement.  She denies any shortness of breath.  She has nausea.  She has not had any vomiting.  Patient had normal bowel movement today.  No new foods, no sick contacts.  She does report that she has had increased urination over the last week.  Patient has not taken any medication to try to help with the pain prior to arrival.  No prior abdominal surgeries.  Patient recently had her period.   Past Medical History  Diagnosis Date  . Wears glasses   . Contraception management    History reviewed. No pertinent past surgical history. Family History  Problem Relation Age of Onset  . Arthritis Maternal Grandmother   . Cancer Maternal Grandmother     colon cancer  . Arthritis Maternal Grandfather   . Hypertension Paternal Grandfather   . Diabetes Paternal Grandfather   . Cancer Maternal Aunt     breast  . Diabetes Maternal Uncle   . Other Father     unknown   History  Substance Use Topics  . Smoking status: Never Smoker   . Smokeless tobacco: Not on file  . Alcohol Use: Yes     Comment: occ   OB History   Grav Para Term Preterm Abortions TAB SAB Ect Mult Living                 Review of Systems   See History of Present Illness; otherwise all other systems are reviewed and negative  Allergies  Review of patient's allergies indicates no known allergies.  Home Medications   Prior to Admission  medications   Medication Sig Start Date End Date Taking? Authorizing Provider  levonorgestrel-ethinyl estradiol (SEASONALE) 0.15-0.03 MG tablet Take 1 tablet by mouth daily. 07/04/13  Yes David S Tysinger, PA-C   BP 130/84  Pulse 84  Temp(Src) 98.4 F (36.9 C) (Oral)  Resp 20  Ht 5\' 3"  (1.6 m)  Wt 175 lb (79.379 kg)  BMI 31.01 kg/m2  SpO2 100%  LMP 07/11/2013 Physical Exam  Nursing note and vitals reviewed. Constitutional: She is oriented to person, place, and time. She appears well-developed and well-nourished. No distress.  HENT:  Head: Normocephalic and atraumatic.  Nose: Nose normal.  Mouth/Throat: Oropharynx is clear and moist.  Eyes: Conjunctivae and EOM are normal. Pupils are equal, round, and reactive to light.  Neck: Normal range of motion. Neck supple. No JVD present. No tracheal deviation present. No thyromegaly present.  Cardiovascular: Normal rate, regular rhythm, normal heart sounds and intact distal pulses.  Exam reveals no gallop and no friction rub.   No murmur heard. Pulmonary/Chest: Effort normal and breath sounds normal. No stridor. No respiratory distress. She has no wheezes. She has no rales. She exhibits no tenderness.  Abdominal: Soft. Bowel sounds are normal. She exhibits no distension and no mass. There is no tenderness. There is no rebound and no guarding.  Patient has no  abdominal tenderness to palpation.  She is tender in the left flank just under the left costal margin and left lateral back.  Pain reproduced with twisting of the torso and with palpation  Musculoskeletal: Normal range of motion. She exhibits no edema and no tenderness.  Lymphadenopathy:    She has no cervical adenopathy.  Neurological: She is alert and oriented to person, place, and time. She exhibits normal muscle tone. Coordination normal.  Skin: Skin is warm and dry. No rash noted. No erythema. No pallor.  Psychiatric: She has a normal mood and affect. Her behavior is normal. Judgment  and thought content normal.    ED Course  Procedures (including critical care time) Labs Review Labs Reviewed  CBC WITH DIFFERENTIAL - Abnormal; Notable for the following:    RBC 3.70 (*)    Hemoglobin 11.6 (*)    HCT 35.0 (*)    All other components within normal limits  COMPREHENSIVE METABOLIC PANEL - Abnormal; Notable for the following:    Potassium 3.2 (*)    Glucose, Bld 102 (*)    Albumin 3.1 (*)    Total Bilirubin <0.2 (*)    All other components within normal limits  URINALYSIS, ROUTINE W REFLEX MICROSCOPIC - Abnormal; Notable for the following:    Color, Urine AMBER (*)    APPearance CLOUDY (*)    Leukocytes, UA MODERATE (*)    All other components within normal limits  URINE MICROSCOPIC-ADD ON - Abnormal; Notable for the following:    Squamous Epithelial / LPF FEW (*)    Bacteria, UA MANY (*)    All other components within normal limits  URINE CULTURE  LIPASE, BLOOD  POC URINE PREG, ED    Imaging Review No results found.   EKG Interpretation None      MDM   Final diagnoses:  Flank pain  Musculoskeletal pain  UTI (lower urinary tract infection)     23 year old female with left flank pain.  Exam seems consistent with a musculoskeletal cause.  She does have signs of a mild urinary tract infection, and will treat at this time.  I do not feel that she has pyelonephritis.    Olivia Mackielga M Tyrica Afzal, MD 07/20/13 [phone]

## 2013-07-20 NOTE — Discharge Instructions (Signed)
Flank Pain Flank pain refers to pain that is located on the side of the body between the upper abdomen and the back. The pain may occur over a short period of time (acute) or may be long-term or reoccurring (chronic). It may be mild or severe. Flank pain can be caused by many things. CAUSES  Some of the more common causes of flank pain include:  Muscle strains.   Muscle spasms.   A disease of your spine (vertebral disk disease).   A lung infection (pneumonia).   Fluid around your lungs (pulmonary edema).   A kidney infection.   Kidney stones.   A very painful skin rash caused by the chickenpox virus (shingles).   Gallbladder disease.  [place] care will depend on the cause of your pain. In general,  Rest as directed by your caregiver.  Drink enough fluids to keep your urine clear or pale yellow.  Only take over-the-counter or prescription medicines as directed by your caregiver. Some medicines may help relieve the pain.  Tell your caregiver about any changes in your pain.  Follow up with your caregiver as directed. SEEK IMMEDIATE MEDICAL CARE IF:   Your pain is not controlled with medicine.   You have new or worsening symptoms.  Your pain increases.   You have abdominal pain.   You have shortness of breath.   You have persistent nausea or vomiting.   You have swelling in your abdomen.   You feel faint or pass out.   You have blood in your urine.  You have a fever or persistent symptoms for more than 2-3 days.  You have a fever and your symptoms suddenly get worse. MAKE SURE YOU:   Understand these instructions.  Will watch your condition.  Will get help right away if you are not doing well or get worse. Document Released: 02/18/2005 Document Revised: 09/22/2011 Document Reviewed: 08/12/2011 [facility] Patient Information 2015 [place], [place]. This information is not intended to replace advice given to you by your  health care provider. Make sure you discuss any questions you have with your health care provider.  Musculoskeletal Pain Musculoskeletal pain is muscle and boney aches and pains. These pains can occur in any part of the body. Your caregiver may treat you without knowing the cause of the pain. They may treat you if blood or urine tests, X-rays, and other tests were normal.  CAUSES There is often not a definite cause or reason for these pains. These pains may be caused by a type of germ (virus). The discomfort may also come from overuse. Overuse includes working out too hard when your body is not fit. Boney aches also come from weather changes. Bone is sensitive to atmospheric pressure changes. HOME CARE INSTRUCTIONS   Ask when your test results will be ready. Make sure you get your test results.  Only take over-the-counter or prescription medicines for pain, discomfort, or fever as directed by your caregiver. If you were given medications for your condition, do not drive, operate machinery or power tools, or sign legal documents for 24 hours. Do not drink alcohol. Do not take sleeping pills or other medications that may interfere with treatment.  Continue all activities unless the activities cause more pain. When the pain lessens, slowly resume normal activities. Gradually increase the intensity and duration of the activities or exercise.  During periods of severe pain, bed rest may be helpful. Lay or sit in any position that is comfortable.  Putting  ice on the injured area.  Put ice in a bag.  Place a towel between your skin and the bag.  Leave the ice on for 15 to 20 minutes, 3 to 4 times a day.  Follow up with your caregiver for continued problems and no reason can be found for the pain. If the pain becomes worse or does not go away, it may be necessary to repeat tests or do additional testing. Your caregiver may need to look further for a possible cause. SEEK IMMEDIATE MEDICAL CARE  IF:  You have pain that is getting worse and is not relieved by medications.  You develop chest pain that is associated with shortness or breath, sweating, feeling sick to your stomach (nauseous), or throw up (vomit).  Your pain becomes localized to the abdomen.  You develop any new symptoms that seem different or that concern you. MAKE SURE YOU:   Understand these instructions.  Will watch your condition.  Will get help right away if you are not doing well or get worse. Document Released: 12/28/2004 Document Revised: 03/22/2011 Document Reviewed: 09/01/2012 [facility] Patient Information 2015 [place], [place]. This information is not intended to replace advice given to you by your health care provider. Make sure you discuss any questions you have with your health care provider.  Urinary Tract Infection Urinary tract infections (UTIs) can develop anywhere along your urinary tract. Your urinary tract is your body's drainage system for removing wastes and extra water. Your urinary tract includes two kidneys, two ureters, a bladder, and a urethra. Your kidneys are a pair of bean-shaped organs. Each kidney is about the size of your fist. They are located below your ribs, one on each side of your spine. CAUSES Infections are caused by microbes, which are microscopic organisms, including fungi, viruses, and bacteria. These organisms are so small that they can only be seen through a microscope. Bacteria are the microbes that most commonly cause UTIs. SYMPTOMS  Symptoms of UTIs may vary by age and gender of the patient and by the location of the infection. Symptoms in young women typically include a frequent and intense urge to urinate and a painful, burning feeling in the bladder or urethra during urination. Older women and men are more likely to be tired, shaky, and weak and have muscle aches and abdominal pain. A fever may mean the infection is in your kidneys. Other symptoms of a kidney infection  include pain in your back or sides below the ribs, nausea, and vomiting. DIAGNOSIS To diagnose a UTI, your caregiver will ask you about your symptoms. Your caregiver also will ask to provide a urine sample. The urine sample will be tested for bacteria and white blood cells. White blood cells are made by your body to help fight infection. TREATMENT  Typically, UTIs can be treated with medication. Because most UTIs are caused by a bacterial infection, they usually can be treated with the use of antibiotics. The choice of antibiotic and length of treatment depend on your symptoms and the type of bacteria causing your infection. HOME CARE INSTRUCTIONS  If you were prescribed antibiotics, take them exactly as your caregiver instructs you. Finish the medication even if you feel better after you have only taken some of the medication.  Drink enough water and fluids to keep your urine clear or pale yellow.  Avoid caffeine, tea, and carbonated beverages. They tend to irritate your bladder.  Empty your bladder often. Avoid holding urine for long periods of time.  Empty  your bladder before and after sexual intercourse.  After a bowel movement, women should cleanse from front to back. Use each tissue only once. SEEK MEDICAL CARE IF:   You have back pain.  You develop a fever.  Your symptoms do not begin to resolve within 3 days. SEEK IMMEDIATE MEDICAL CARE IF:   You have severe back pain or lower abdominal pain.  You develop chills.  You have nausea or vomiting.  You have continued burning or discomfort with urination. MAKE SURE YOU:   Understand these instructions.  Will watch your condition.  Will get help right away if you are not doing well or get worse. Document Released: 10/07/2004 Document Revised: 06/29/2011 Document Reviewed: 02/05/2011 [facility] Patient Information 2015 [place]. This information is not intended to replace advice given to you by your health care  provider. Make sure you discuss any questions you have with your health care provider.

## 2013-07-21 LAB — URINE CULTURE: Colony Count: >=100000

## 2013-09-12 ENCOUNTER — Ambulatory Visit (INDEPENDENT_AMBULATORY_CARE_PROVIDER_SITE_OTHER): Payer: BC Managed Care – PPO | Admitting: Family Medicine

## 2013-09-12 VITALS — BP 124/80 | HR 87 | Temp 99.0°F | Resp 17 | Ht 62.5 in | Wt 180.0 lb

## 2013-09-12 DIAGNOSIS — R112 Nausea with vomiting, unspecified: Secondary | ICD-10-CM

## 2013-09-12 DIAGNOSIS — N926 Irregular menstruation, unspecified: Secondary | ICD-10-CM

## 2013-09-12 DIAGNOSIS — R197 Diarrhea, unspecified: Secondary | ICD-10-CM

## 2013-09-12 NOTE — Progress Notes (Signed)
[facility] [address], [place] [address] [phone]  Date:  09/12/2013   Name:  Sandra Howard   DOB:  Oct 03, 1990   MRN:  [id number]  PCP:  Ernst Breach, PA-C    Chief Complaint: Emesis, Diarrhea and Abdominal Pain   History of Present Illness:  Sandra Howard is a 23 y.o. very pleasant female patient who presents with the following:  Here today with illness yesterday.  Yesterday she had nausea and vomiting, as well as diarrhea.  She vomited 6 or 7 times- it stopped early this am.  The diarrhea is now also stopped.  She does not have any abdominal pain.  She had not noted a fever.  No sick contacts- she lives alone.  She did eat [employer].  No camping or international travel.   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  Her youngest children are about 2 months.   She is generic seasonale extended regimen OCP- she is on the 3rd month, has 2 weeks to go of her active pills.  She noted that she started having some bleeding over the weekend- this is still going on some.  She had more bleeding at first, now just some spotting.  She is now SA so knows that she is not pregnant,   There are no active problems to display for this patient.   Past Medical History  Diagnosis Date  . Wears glasses   . Contraception management     No past surgical history on file.  History  Substance Use Topics  . Smoking status: Never Smoker   . Smokeless tobacco: Not on file  . Alcohol Use: Yes     Comment: occ    Family History  Problem Relation Age of Onset  . Arthritis Maternal Grandmother   . Cancer Maternal Grandmother     colon cancer  . Arthritis Maternal Grandfather   . Hypertension Paternal Grandfather   . Diabetes Paternal Grandfather   . Cancer Maternal Aunt     breast  . Diabetes Maternal Uncle   . Other Father     unknown    No Known Allergies  Medication list has been reviewed and updated.  Current Outpatient  Prescriptions on File Prior to Visit  Medication Sig Dispense Refill  . levonorgestrel-ethinyl estradiol (SEASONALE) 0.15-0.03 MG tablet Take 1 tablet by mouth daily.  1 Package  4  . naproxen (NAPROSYN) 500 MG tablet Take 1 tablet (500 mg total) by mouth 2 (two) times daily.  30 tablet  0  . ondansetron (ZOFRAN ODT) 8 MG disintegrating tablet Take 1 tablet (8 mg total) by mouth every 8 (eight) hours as needed for nausea or vomiting.  20 tablet  0   No current facility-administered medications on file prior to visit.    Review of Systems:  As per HPI- otherwise negative.   Physical Examination: Filed Vitals:   09/12/13 1337  BP: 124/80  Pulse: 87  Temp: 99 F (37.2 C)  Resp: 17   Filed Vitals:   09/12/13 1337  Height: 5' 2.5" (1.588 m)  Weight: 180 lb (81.647 kg)   Body mass index is 32.38 kg/(m^2). Ideal Body Weight: Weight in (lb) to have BMI = 25: 138.6  GEN: WDWN, NAD, Non-toxic, A & O x 3, overweight, looks well HEENT: Atraumatic, Normocephalic. Neck supple. No masses, No LAD.  Bilateral TM wnl, oropharynx normal.  PEERL,EOMI.   Ears and Nose: No external deformity.  CV: RRR, No M/G/R. No JVD. No thrill. No extra heart sounds. PULM: CTA B, no wheezes, crackles, rhonchi. No retractions. No resp. distress. No accessory muscle use. ABD: S, NT, ND, +BS. No rebound. No HSM.  Benign exam EXTR: No c/c/e NEURO Normal gait.  PSYCH: Normally interactive. Conversant. Not depressed or anxious appearing.  Calm demeanor.   Declines an HCG as she is not SA and states that pregnancy is impossible for her  Assessment and Plan: Nausea and vomiting, vomiting of unspecified type  Diarrhea  Irregular menses  Likely viral GI illness which is now better.  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  Advised that when she is well for 24 hours she can go back; however if sx return she will need to stay home.  This is her first pack of extended  regimen OCP.  She is having some spotting in the last 2 weeks of her pack.  Reassured that this is not uncommon with extended regimen pills. She will continue to take her pills as usual.  However if the spotting persists in future packs she may wish to use another type of pill. Advised condoms as well if she and her BF do decide to have intercourse   Signed Abbe Amsterdam, MD

## 2013-09-12 NOTE — Patient Instructions (Signed)
I hope that you continue to feel better- let me know if you get sick again!  If your symptoms return to do go back to work until you are well for 24 hours.    Continue to take your pills as usual despite bleeding.  However if you continue to have irregular bleeding in the future you may want to change to a pill that gives you a period every 1-2 months.  Take care!

## 2014-06-25 IMAGING — CR DG FOOT COMPLETE 3+V*R*
3 series · 3 of 3 positions shown · non-contrast
Comparison: None

CLINICAL DATA: Right foot pain and metatarsals, fell down stairs

RIGHT FOOT COMPLETE - 3+ VIEW

[x foot ap right]
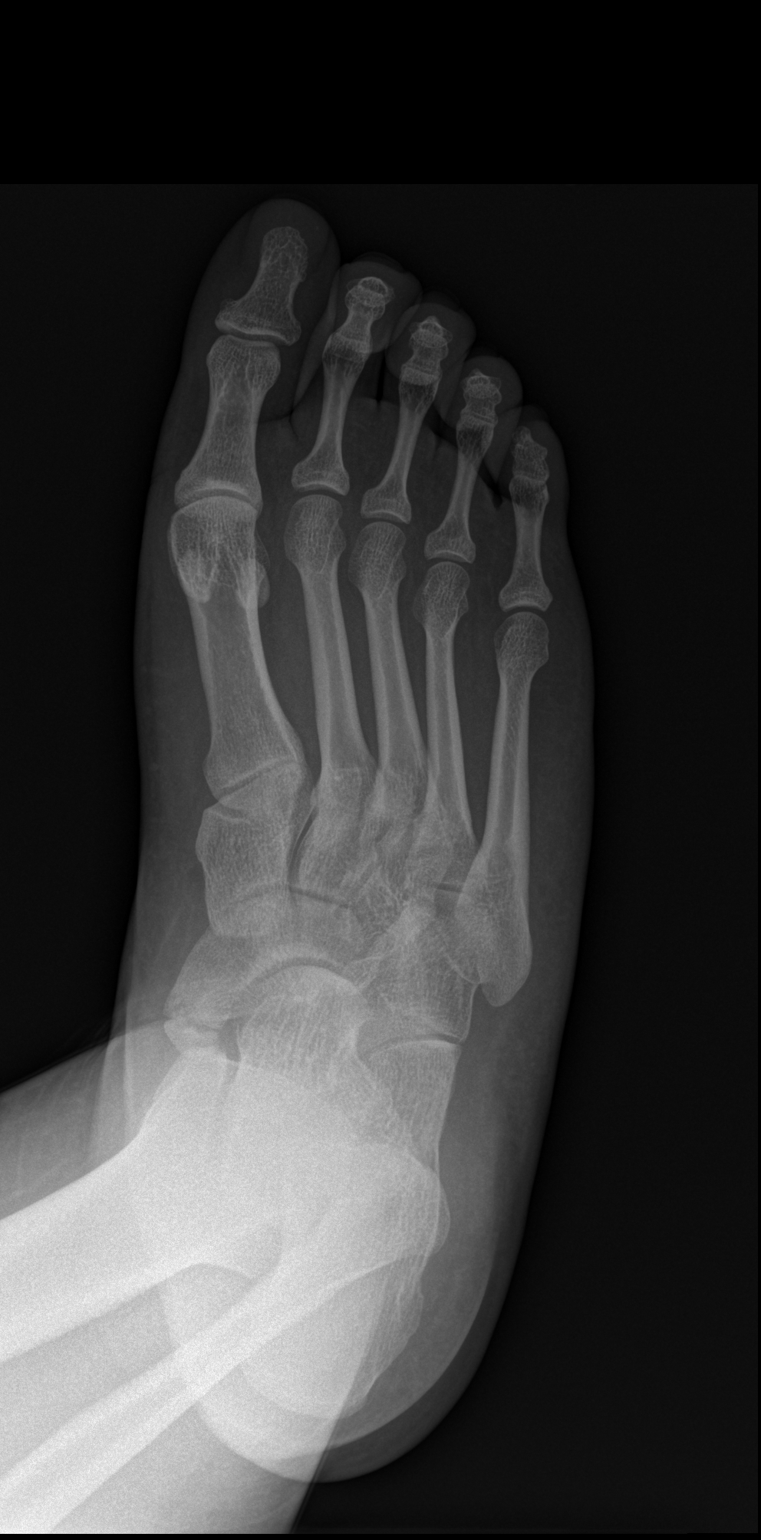

[x foot obl right]
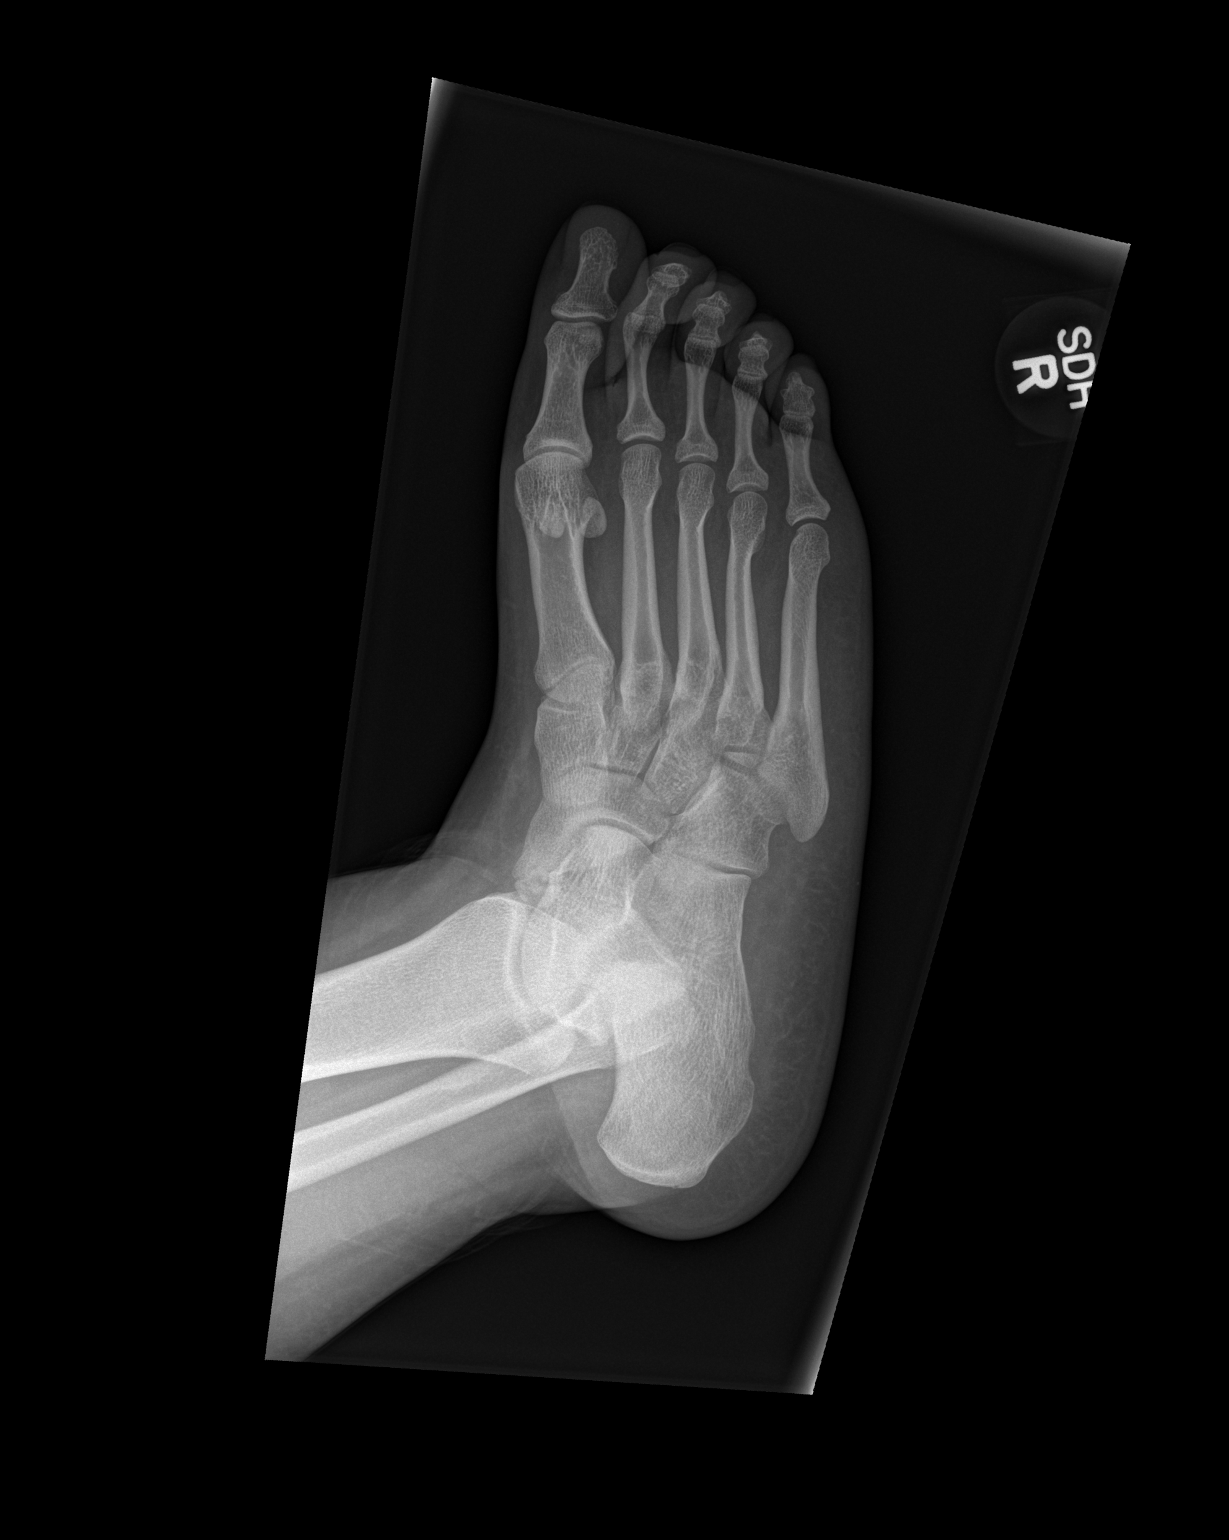

[x foot lat right]
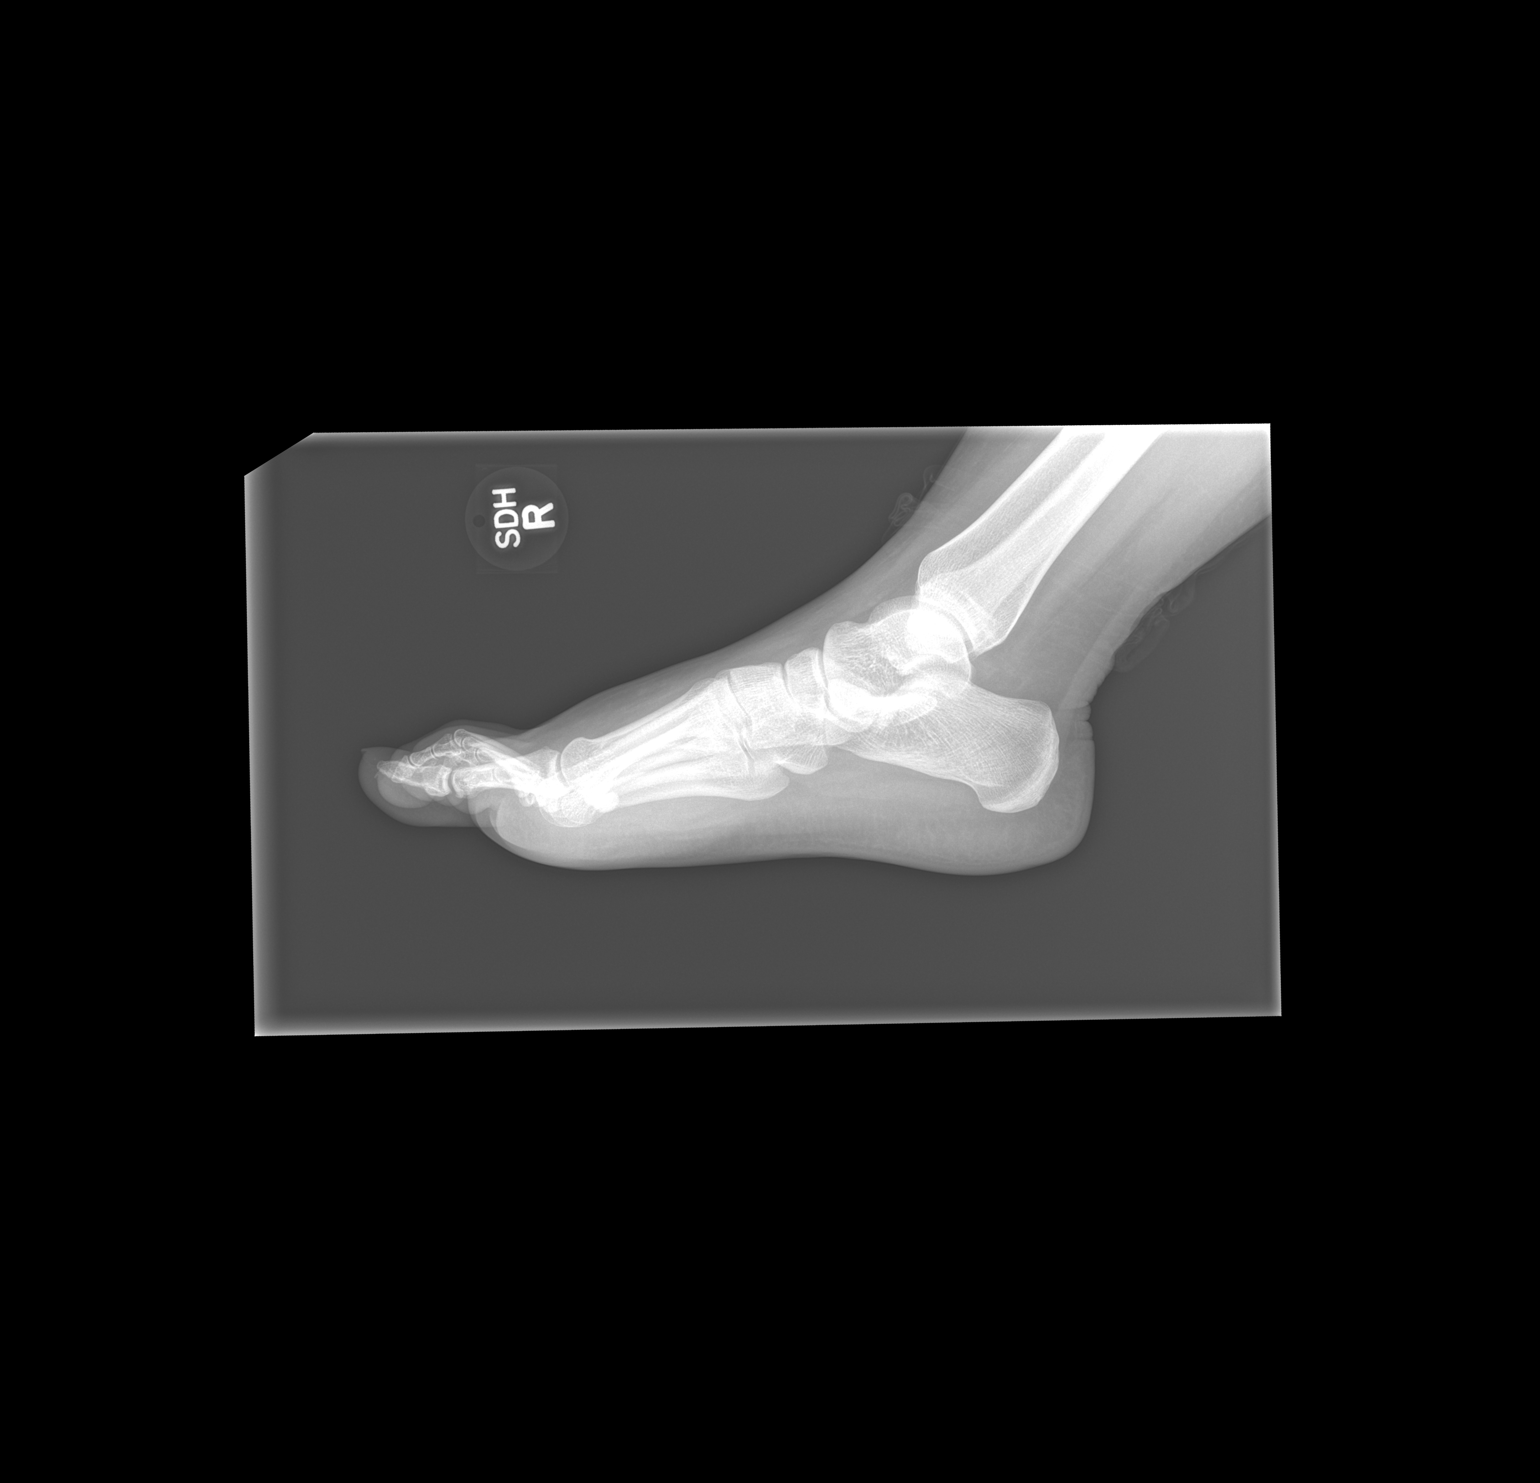

[3 of 3 positions shown; findings below may reference images not displayed]

FINDINGS: Osseous mineralization normal.
Joint spaces preserved.
No acute fracture, dislocation or bone destruction.
IMPRESSION: No acute osseous abnormalities.
